# Patient Record
Sex: Male | Born: 2000 | Race: White | Hispanic: Yes | Marital: Single | Smoking: Never smoker
Health system: Southern US, Community
[De-identification: ages and names within clinical notes are randomized; demographics above are authoritative.]

---
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
## 2021-03-08 ENCOUNTER — Emergency Department (HOSPITAL_COMMUNITY)
Admission: EM | Admit: 2021-03-08 | Discharge: 2021-03-08 | Disposition: A | Discharge status: Home / Self Care | Payer: Self-pay | Attending: Emergency Medicine | Admitting: Emergency Medicine

## 2021-03-08 ENCOUNTER — Encounter (HOSPITAL_COMMUNITY): Payer: Self-pay | Admitting: Emergency Medicine

## 2021-03-08 ENCOUNTER — Other Ambulatory Visit: Payer: Self-pay

## 2021-03-08 DIAGNOSIS — D72829 Elevated white blood cell count, unspecified: Secondary | ICD-10-CM | POA: Insufficient documentation

## 2021-03-08 DIAGNOSIS — Z20822 Contact with and (suspected) exposure to covid-19: Secondary | ICD-10-CM | POA: Insufficient documentation

## 2021-03-08 DIAGNOSIS — J020 Streptococcal pharyngitis: Secondary | ICD-10-CM | POA: Insufficient documentation

## 2021-03-08 LAB — BASIC METABOLIC PANEL
Anion gap: 9 (ref 5–15)
BUN: 6 mg/dL (ref 6–20)
CO2: 25 mmol/L (ref 22–32)
Calcium: 9.2 mg/dL (ref 8.9–10.3)
Chloride: 104 mmol/L (ref 98–111)
Creatinine, Ser: 0.78 mg/dL (ref 0.61–1.24)
GFR, Estimated: >60 mL/min (ref >60)
Glucose, Bld: 114 mg/dL — ABNORMAL HIGH (ref 70–99)
Potassium: 3.8 mmol/L (ref 3.5–5.1)
Sodium: 138 mmol/L (ref 135–145)

## 2021-03-08 LAB — CBC WITH DIFFERENTIAL/PLATELET
Abs Immature Granulocytes: 0.05 10*3/uL (ref 0.00–0.07)
Basophils Absolute: 0 10*3/uL (ref 0.0–0.1)
Basophils Relative: 0 %
Eosinophils Absolute: 0.1 10*3/uL (ref 0.0–0.5)
Eosinophils Relative: 1 %
HCT: 46.6 % (ref 39.0–52.0)
Hemoglobin: 15.4 g/dL (ref 13.0–17.0)
Immature Granulocytes: 0 %
Lymphocytes Relative: 16 %
Lymphs Abs: 2.1 10*3/uL (ref 0.7–4.0)
MCH: 27.4 pg (ref 26.0–34.0)
MCHC: 33 g/dL (ref 30.0–36.0)
MCV: 82.9 fL (ref 80.0–100.0)
Monocytes Absolute: 1.1 10*3/uL — ABNORMAL HIGH (ref 0.1–1.0)
Monocytes Relative: 8 %
Neutro Abs: 10 10*3/uL — ABNORMAL HIGH (ref 1.7–7.7)
Neutrophils Relative %: 75 %
Platelets: 342 10*3/uL (ref 150–400)
RBC: 5.62 MIL/uL (ref 4.22–5.81)
RDW: 12.8 % (ref 11.5–15.5)
WBC: 13.3 10*3/uL — ABNORMAL HIGH (ref 4.0–10.5)
nRBC: 0 % (ref 0.0–0.2)

## 2021-03-08 LAB — RESP PANEL BY RT-PCR (FLU A&B, COVID) ARPGX2
Influenza A by PCR: NEGATIVE
Influenza B by PCR: NEGATIVE
SARS Coronavirus 2 by RT PCR: NEGATIVE

## 2021-03-08 LAB — GROUP A STREP BY PCR: Group A Strep by PCR: DETECTED — AB

## 2021-03-08 MED ORDER — PENICILLIN V POTASSIUM 500 MG PO TABS: 500.0000 mg | ORAL_TABLET | Freq: Three times a day (TID) | ORAL | 0 refills | Status: AC

## 2021-03-08 MED ORDER — LIDOCAINE VISCOUS HCL 2 % MT SOLN: 15.0000 mL | OROMUCOSAL | 0 refills | Status: DC | PRN

## 2021-03-08 MED ORDER — PENICILLIN V POTASSIUM 500 MG PO TABS: 500.0000 mg | ORAL_TABLET | Freq: Three times a day (TID) | ORAL | 0 refills | Status: DC

## 2021-03-08 MED ORDER — ACETAMINOPHEN 500 MG PO TABS
1000.0000 mg | ORAL_TABLET | Freq: Four times a day (QID) | ORAL | Status: DC | PRN
  Administered 2021-03-08: 1000 mg via ORAL
  Filled 2021-03-08: qty 2

## 2021-03-08 MED ORDER — LIDOCAINE VISCOUS HCL 2 % MT SOLN: 15.0000 mL | OROMUCOSAL | 0 refills | Status: AC | PRN

## 2021-03-08 MED ORDER — DEXAMETHASONE SODIUM PHOSPHATE 10 MG/ML IJ SOLN
10.0000 mg | Freq: Once | INTRAMUSCULAR | Status: AC
  Administered 2021-03-08: 10 mg via INTRAMUSCULAR
  Filled 2021-03-08: qty 1

## 2021-03-08 NOTE — ED Notes (Signed)
All discharge instructions reviewed with patient and patient verbalized understanding. Patient stable and ambulatory at time of discharge.

## 2021-03-08 NOTE — Discharge Instructions (Addendum)
You came to the emergency department today to be evaluated for your sore throat.  Your strep test was positive.  Due to this you were started on the antibiotic penicillin.  Please take this medication as prescribed.  I have also given you prescription for viscous lidocaine.  You may use this every 6 hours as needed to help with your sore throat.  Today in the emergency department you received a steroid injection.  Some common side effects include feelings of extra energy, feeling warm, increased appetite, and stomach upset.  If you are diabetic your sugars may run higher than usual.   You may have diarrhea from the antibiotics.  It is very important that you continue to take the antibiotics even if you get diarrhea unless a medical professional tells you that you may stop taking them.  If you stop too early the bacteria you are being treated for will become stronger and you may need different, more powerful antibiotics that have more side effects and worsening diarrhea.  Please stay well hydrated and consider probiotics as they may decrease the severity of your diarrhea.   Please take Ibuprofen (Advil, motrin) and Tylenol (acetaminophen) to relieve your pain.    You may take up to 600 MG (3 pills) of normal strength ibuprofen every 8 hours as needed.   You make take tylenol, up to 1,000 mg (two extra strength pills) every 8 hours as needed.   It is safe to take ibuprofen and tylenol at the same time as they work differently.   Do not take more than 3,000 mg tylenol in a 24 hour period (not more than one dose every 8 hours.  Please check all medication labels as many medications such as pain and cold medications may contain tylenol.  Do not drink alcohol while taking these medications.  Do not take other NSAID'S while taking ibuprofen (such as aleve or naproxen).  Please take ibuprofen with food to decrease stomach upset.  Get help right away if: You have new symptoms, such as vomiting, severe  headache, stiff or painful neck, chest pain, or shortness of breath. You have severe throat pain, drooling, or changes in your voice. You have swelling of the neck, or the skin on the neck becomes red and tender. You have signs of dehydration, such as tiredness (fatigue), dry mouth, and decreased urination. You become increasingly sleepy, or you cannot wake up completely. Your joints become red or painful.

## 2021-03-08 NOTE — ED Provider Notes (Signed)
[facility] EMERGENCY DEPARTMENT Provider Note   CSN: [id number] Arrival date & time: 03/08/21  0841     History  Chief Complaint  Patient presents with   Sore Throat    Chad Davila is a 21 y.o. male with no pertinent past medical history.  Presents the emergency department with a chief complaint of sore throat.  Patient reports that he has been having sore throat over the last week.  States that he has noticed swelling to the right side of his throat.  Patient states that at night he has difficulty breathing and swallowing.  Additionally patient endorses fever and chills.  States that he has been taking DayQuil at home with some improvement of his fever.  Denies any neck pain, neck stiffness, trismus, high potato voice, rhinorrhea, nasal congestion, cough, abdominal pain, nausea, vomiting, diarrhea.   Sore Throat Pertinent negatives include no abdominal pain and no shortness of breath.      Home Medications Prior to Admission medications   Not on File      Allergies    Patient has no known allergies.    Review of Systems   Review of Systems  Constitutional:  Negative for chills and fever.  HENT:  Positive for drooling, sore throat and trouble swallowing. Negative for congestion, rhinorrhea and voice change.   Eyes:  Negative for visual disturbance.  Respiratory:  Negative for cough and shortness of breath.   Gastrointestinal:  Negative for abdominal pain, nausea and vomiting.  Musculoskeletal:  Negative for neck pain and neck stiffness.  Skin:  Negative for color change and rash.  Psychiatric/Behavioral:  Negative for confusion.    Physical Exam Updated Vital Signs BP 125/79 (BP Location: Right Arm)    Pulse 66    Temp 98.9 F (37.2 C)    Resp 14    SpO2 100%  Physical Exam Vitals and nursing note reviewed.  Constitutional:      General: He is not in acute distress.    Appearance: He is not ill-appearing, toxic-appearing or diaphoretic.   HENT:     Head: Normocephalic.     Mouth/Throat:     Lips: Pink.     Mouth: Mucous membranes are moist.     Tongue: No lesions. Tongue does not deviate from midline.     Palate: No mass and lesions.     Pharynx: Oropharynx is clear. Posterior oropharyngeal erythema present. No pharyngeal swelling, oropharyngeal exudate or uvula swelling.     Tonsils: Tonsillar exudate present. No tonsillar abscesses. 3+ on the right. 3+ on the left.     Comments: Patient will have swelling to tonsils bilaterally.  Erythema present to bilateral tonsils as well as oropharynx.  Exudate noted to bilateral tonsils.  Patient able to speak in full complete sentences without difficulty.  Handle oral secretions without difficulty.  No trismus. Eyes:     General: No scleral icterus.       Right eye: No discharge.        Left eye: No discharge.  Cardiovascular:     Rate and Rhythm: Normal rate.  Pulmonary:     Effort: Pulmonary effort is normal. No tachypnea, bradypnea or respiratory distress.     Breath sounds: Normal breath sounds. No stridor.  Musculoskeletal:     Cervical back: Full passive range of motion without pain, normal range of motion and neck supple. No edema, erythema, signs of trauma, rigidity, torticollis or crepitus. No pain with movement, spinous process tenderness or muscular  tenderness. Normal range of motion.  Skin:    General: Skin is warm and dry.  Neurological:     General: No focal deficit present.     Mental Status: He is alert.  Psychiatric:        Behavior: Behavior is cooperative.    ED Results / Procedures / Treatments   Labs (all labs ordered are listed, but only abnormal results are displayed) Labs Reviewed  GROUP A STREP BY PCR - Abnormal; Notable for the following components:      Result Value   Group A Strep by PCR DETECTED (*)    All other components within normal limits  CBC WITH DIFFERENTIAL/PLATELET - Abnormal; Notable for the following components:   WBC 13.3 (*)     Neutro Abs 10.0 (*)    Monocytes Absolute 1.1 (*)    All other components within normal limits  BASIC METABOLIC PANEL - Abnormal; Notable for the following components:   Glucose, Bld 114 (*)    All other components within normal limits  RESP PANEL BY RT-PCR (FLU A&B, COVID) ARPGX2    EKG None  Radiology No results found.  Procedures Procedures    Medications Ordered in ED Medications  acetaminophen (TYLENOL) tablet 1,000 mg (1,000 mg Oral Given 03/08/21 0856)  dexamethasone (DECADRON) injection 10 mg (has no administration in time range)    ED Course/ Medical Decision Making/ A&P                           Medical Decision Making Amount and/or Complexity of Data Reviewed Labs: ordered.  Risk OTC drugs. Prescription drug management.   Alert 21 year old male in no acute stress, nontoxic-appearing.  Presents to the ED with a chief plaint of sore throat.    Information was obtained from patient.  Medical records were were reviewed.  Lab work was obtained while patient was in triage.  I independently reviewed all lab results.  Pertinent findings include: -BMP unremarkable -Leukocytosis at 13.3 -Negative for COVID-19 and influenza -Group A strep detected  Physical exam patient has swelling, erythema and exudate to bilateral tonsils.  No signs of peritonsillar abscess.  Able to handle oral secretions without difficulty.  Obtaining CT soft tissue neck was considered to evaluate for deep space neck infection versus peritonsillar abscess however patient has no signs of peritonsillar abscess on exam, no swelling to 7 ambulation base, and no pain with passive range of motion of neck.  Low suspicion for a neck infection, Ludewig's angina, or peritonsillar abscess.  Patient was given Tylenol upon arrival in the emergency department.  States that his pain and fever improved after this medication.  Decision making with patient about treatment of penicillin IM versus oral  antibiotics.  Patient elects for oral antibiotics.  Additionally will prescribe patient with viscous lidocaine and give him a shot of Decadron prior to leaving the hospital.    Discussed results, findings, treatment and follow up. Patient advised of return precautions. Patient verbalized understanding and agreed with plan.         Final Clinical Impression(s) / ED Diagnoses Final diagnoses:  Strep throat    Rx / DC Orders ED Discharge Orders          Ordered    lidocaine (XYLOCAINE) 2 % solution  As needed,   Status:  Discontinued        03/08/21 1640    penicillin v potassium (VEETID) 500 MG tablet  3 times daily,  Status:  Discontinued        03/08/21 1640    lidocaine (XYLOCAINE) 2 % solution  As needed        03/08/21 1641    penicillin v potassium (VEETID) 500 MG tablet  3 times daily        03/08/21 1641              Loni Beckwith, PA-C 03/08/21 [place], Ankit, MD 03/09/21 [phone]

## 2021-03-08 NOTE — ED Triage Notes (Signed)
Pt. Stated, Chad Davila had a sore throat for a week and making it hard to breath at night
